# Patient Record
Sex: Female | Born: 1970 | Hispanic: Yes | Marital: Single | Smoking: Never smoker
Health system: Southern US, Community
[De-identification: ages and names within clinical notes are randomized; demographics above are authoritative.]

---

## 2010-11-08 ENCOUNTER — Ambulatory Visit: Payer: Self-pay | Admitting: Family Medicine

## 2010-11-09 ENCOUNTER — Ambulatory Visit: Payer: Self-pay

## 2010-11-20 ENCOUNTER — Emergency Department: Payer: Self-pay | Admitting: *Deleted

## 2010-11-22 LAB — PATHOLOGY REPORT

## 2010-12-27 ENCOUNTER — Ambulatory Visit: Payer: Self-pay

## 2013-01-03 ENCOUNTER — Ambulatory Visit: Payer: Self-pay | Admitting: Family Medicine

## 2013-01-03 LAB — PREGNANCY, URINE: Pregnancy Test, Urine: NEGATIVE m[IU]/mL

## 2013-03-12 ENCOUNTER — Ambulatory Visit: Payer: Self-pay

## 2013-03-12 LAB — PREGNANCY, URINE: Pregnancy Test, Urine: POSITIVE m[IU]/mL

## 2013-04-25 ENCOUNTER — Emergency Department: Payer: Self-pay | Admitting: Emergency Medicine

## 2013-04-25 LAB — CBC WITH DIFFERENTIAL/PLATELET
BASOS ABS: 0.1 10*3/uL (ref 0.0–0.1)
BASOS PCT: 0.9 %
EOS ABS: 0.3 10*3/uL (ref 0.0–0.7)
Eosinophil %: 2.9 %
HCT: 25.2 % — ABNORMAL LOW (ref 35.0–47.0)
HGB: 7.8 g/dL — ABNORMAL LOW (ref 12.0–16.0)
Lymphocyte #: 2.4 10*3/uL (ref 1.0–3.6)
Lymphocyte %: 25.7 %
MCH: 19.8 pg — ABNORMAL LOW (ref 26.0–34.0)
MCHC: 30.8 g/dL — AB (ref 32.0–36.0)
MCV: 64 fL — ABNORMAL LOW (ref 80–100)
MONOS PCT: 4.4 %
Monocyte #: 0.4 x10 3/mm (ref 0.2–0.9)
NEUTROS PCT: 66.1 %
Neutrophil #: 6.1 10*3/uL (ref 1.4–6.5)
Platelet: 218 10*3/uL (ref 150–440)
RBC: 3.92 10*6/uL (ref 3.80–5.20)
RDW: 18 % — ABNORMAL HIGH (ref 11.5–14.5)
WBC: 9.3 10*3/uL (ref 3.6–11.0)

## 2013-04-25 LAB — COMPREHENSIVE METABOLIC PANEL
ALT: 16 U/L (ref 12–78)
ANION GAP: 4 — AB (ref 7–16)
AST: 20 U/L (ref 15–37)
Albumin: 3.7 g/dL (ref 3.4–5.0)
Alkaline Phosphatase: 105 U/L
BILIRUBIN TOTAL: 0.3 mg/dL (ref 0.2–1.0)
BUN: 17 mg/dL (ref 7–18)
CALCIUM: 8.2 mg/dL — AB (ref 8.5–10.1)
Chloride: 108 mmol/L — ABNORMAL HIGH (ref 98–107)
Co2: 25 mmol/L (ref 21–32)
Creatinine: 0.61 mg/dL (ref 0.60–1.30)
EGFR (African American): >60
EGFR (Non-African Amer.): >60
Glucose: 88 mg/dL (ref 65–99)
Osmolality: 275 (ref 275–301)
Potassium: 3.4 mmol/L — ABNORMAL LOW (ref 3.5–5.1)
SODIUM: 137 mmol/L (ref 136–145)
Total Protein: 7.9 g/dL (ref 6.4–8.2)

## 2013-04-25 LAB — LIPASE, BLOOD: Lipase: 157 U/L (ref 73–393)

## 2013-04-25 LAB — CBC
HCT: 26.5 % — ABNORMAL LOW (ref 35.0–47.0)
HGB: 8.4 g/dL — ABNORMAL LOW (ref 12.0–16.0)
MCH: 21.4 pg — ABNORMAL LOW (ref 26.0–34.0)
MCHC: 31.5 g/dL — AB (ref 32.0–36.0)
MCV: 68 fL — AB (ref 80–100)
Platelet: 174 10*3/uL (ref 150–440)
RBC: 3.9 10*6/uL (ref 3.80–5.20)
RDW: 22.1 % — ABNORMAL HIGH (ref 11.5–14.5)
WBC: 12.6 10*3/uL — ABNORMAL HIGH (ref 3.6–11.0)

## 2013-04-25 LAB — HCG, QUANTITATIVE, PREGNANCY: Beta Hcg, Quant.: 4502 m[IU]/mL — ABNORMAL HIGH

## 2013-05-01 ENCOUNTER — Ambulatory Visit: Payer: Self-pay | Admitting: Obstetrics and Gynecology

## 2013-05-01 LAB — BASIC METABOLIC PANEL
Anion Gap: 6 — ABNORMAL LOW (ref 7–16)
BUN: 17 mg/dL (ref 7–18)
CALCIUM: 8.6 mg/dL (ref 8.5–10.1)
Chloride: 107 mmol/L (ref 98–107)
Co2: 27 mmol/L (ref 21–32)
Creatinine: 0.68 mg/dL (ref 0.60–1.30)
EGFR (African American): >60
EGFR (Non-African Amer.): >60
Glucose: 100 mg/dL — ABNORMAL HIGH (ref 65–99)
Osmolality: 281 (ref 275–301)
Potassium: 3.5 mmol/L (ref 3.5–5.1)
SODIUM: 140 mmol/L (ref 136–145)

## 2013-05-01 LAB — CBC WITH DIFFERENTIAL/PLATELET
BASOS ABS: 0.1 10*3/uL (ref 0.0–0.1)
Basophil %: 1.2 %
EOS ABS: 0.3 10*3/uL (ref 0.0–0.7)
EOS PCT: 6.2 %
HCT: 27.5 % — AB (ref 35.0–47.0)
HGB: 8.2 g/dL — AB (ref 12.0–16.0)
Lymphocyte #: 1.5 10*3/uL (ref 1.0–3.6)
Lymphocyte %: 28 %
MCH: 20.3 pg — ABNORMAL LOW (ref 26.0–34.0)
MCHC: 29.8 g/dL — AB (ref 32.0–36.0)
MCV: 68 fL — ABNORMAL LOW (ref 80–100)
MONO ABS: 0.4 x10 3/mm (ref 0.2–0.9)
Monocyte %: 6.6 %
NEUTROS PCT: 58 %
Neutrophil #: 3.2 10*3/uL (ref 1.4–6.5)
PLATELETS: 190 10*3/uL (ref 150–440)
RBC: 4.04 10*6/uL (ref 3.80–5.20)
RDW: 21.6 % — AB (ref 11.5–14.5)
WBC: 5.5 10*3/uL (ref 3.6–11.0)

## 2013-05-04 LAB — PATHOLOGY REPORT

## 2013-05-08 ENCOUNTER — Ambulatory Visit: Payer: Self-pay | Admitting: Physician Assistant

## 2013-05-19 ENCOUNTER — Encounter: Payer: Self-pay | Admitting: Physician Assistant

## 2013-05-29 ENCOUNTER — Encounter: Payer: Self-pay | Admitting: Physician Assistant

## 2013-06-11 ENCOUNTER — Ambulatory Visit: Payer: Self-pay | Admitting: Physician Assistant

## 2013-06-11 LAB — PREGNANCY, URINE: Pregnancy Test, Urine: NEGATIVE m[IU]/mL

## 2013-06-29 ENCOUNTER — Encounter: Payer: Self-pay | Admitting: Physician Assistant

## 2013-07-29 ENCOUNTER — Encounter: Payer: Self-pay | Admitting: Physician Assistant

## 2014-03-22 ENCOUNTER — Ambulatory Visit: Payer: Self-pay | Admitting: Registered Nurse

## 2014-03-29 ENCOUNTER — Ambulatory Visit: Payer: Self-pay | Admitting: Registered Nurse

## 2014-05-22 NOTE — Op Note (Signed)
PATIENT NAME:  Charlene Mcintosh, Charlene Mcintosh MR#:  [id number] DATE OF BIRTH:  02/11/1970  DATE OF PROCEDURE:  05/01/2013  PREOPERATIVE DIAGNOSIS:  Incomplete abortion.  POSTOPERATIVE DIAGNOSIS:  Incomplete abortion, plus retained products of conception.   PROCEDURE:  Suction curette D and C.   ESTIMATED BLOOD LOSS:  50 mL.   FINDINGS:  Products of conception, approximately nine week size uterus, closed cervix, firm fundus.   SURGEON:  Bengie Kaucher C. Marlean Mortell, M.D.   ANESTHESIA:  General endotracheal anesthesia.    PROCEDURE:  The patient was taken to the Operating Room and placed in the supine position.  After adequate general endotracheal anesthesia (Dictation Anomaly) was instilled the patient was prepped and draped in the usual sterile fashion.  Timeout was performed.  A side-opening speculum was placed in the patient's vagina.  The anterior lip of the cervix was grasped with a single-tooth tenaculum.  The uterus was sounded.  The cervix was serially dilated to accommodate an 8 curved rigid curette.  Suction was placed into the uterus and the suction curette was turned on.  Products of conception were removed.  Gentle curettage was performed with a metal curette.  Gritty surface was identified.  No extra tissue was felt.  One final pass of the suction was performed.  The single-tooth tenaculum was removed.  The patient was given Methergine 0.2 mg IM.  The fundus was massaged with bimanual exam.  The uterus was found to be firm.  Good hemostasis was identified.  The patient was catheterized.  Clear urine was noted.  The patient was laid supine and taken to the recovery room after having tolerated the procedure.     ____________________________ Ocie Stanzione C. Deziyah Arvin, MD cck:ea D: 05/02/2013 16:11:03 ET T: 05/02/2013 17:17:54 ET JOB#: [id number]  cc: Janney Priego C. Elizet Kaplan, MD, <Dictator> Thamara Leger C Alyana Kreiter MD ELECTRONICALLY SIGNED 05/05/2013 14:43 

## 2014-05-22 NOTE — Op Note (Signed)
PATIENT NAME:  Charlene RocheROSENDO JIMENEZ, [name] MR#:  [id number] DATE OF BIRTH:  [birth year]  DATE OF PROCEDURE:  05/01/2013  PREOPERATIVE DIAGNOSIS:  Incomplete abortion.  POSTOPERATIVE DIAGNOSIS:  Incomplete abortion, plus retained products of conception.   PROCEDURE:  Suction curette D and C.   ESTIMATED BLOOD LOSS:  50 mL.   FINDINGS:  Products of conception, approximately nine week size uterus, closed cervix, firm fundus.   SURGEON:  Elliot Gurneyarrie C. Limuel Nieblas, M.D.   ANESTHESIA:  General endotracheal anesthesia.    PROCEDURE:  The patient was taken to the Operating Room and placed in the supine position.  After adequate general endotracheal anesthesia (Dictation Anomaly) was instilled the patient was prepped and draped in the usual sterile fashion.  Timeout was performed.  A side-opening speculum was placed in the patient's vagina.  The anterior lip of the cervix was grasped with a single-tooth tenaculum.  The uterus was sounded.  The cervix was serially dilated to accommodate an 8 curved rigid curette.  Suction was placed into the uterus and the suction curette was turned on.  Products of conception were removed.  Gentle curettage was performed with a metal curette.  Gritty surface was identified.  No extra tissue was felt.  One final pass of the suction was performed.  The single-tooth tenaculum was removed.  The patient was given Methergine 0.2 mg IM.  The fundus was massaged with bimanual exam.  The uterus was found to be firm.  Good hemostasis was identified.  The patient was catheterized.  Clear urine was noted.  The patient was laid supine and taken to the recovery room after having tolerated the procedure.     ____________________________ Elliot Gurneyarrie C. Jessie Cowher, MD cck:ea D: 05/02/2013 16:11:03 ET T: 05/02/2013 17:17:54 ET JOB#: [id number]  cc: Elliot Gurneyarrie C. Genean Adamski, MD, <Dictator> Elliot GurneyARRIE C Burns Timson MD ELECTRONICALLY SIGNED 05/05/2013 14:43

## 2020-11-30 ENCOUNTER — Emergency Department: Payer: Worker's Compensation

## 2020-11-30 ENCOUNTER — Emergency Department
Admission: EM | Admit: 2020-11-30 | Discharge: 2020-11-30 | Disposition: A | Discharge status: Home / Self Care | Payer: Worker's Compensation | Attending: Emergency Medicine | Admitting: Emergency Medicine

## 2020-11-30 ENCOUNTER — Other Ambulatory Visit: Payer: Self-pay

## 2020-11-30 DIAGNOSIS — X509XXA Other and unspecified overexertion or strenuous movements or postures, initial encounter: Secondary | ICD-10-CM | POA: Diagnosis not present

## 2020-11-30 DIAGNOSIS — S3992XA Unspecified injury of lower back, initial encounter: Secondary | ICD-10-CM | POA: Diagnosis present

## 2020-11-30 DIAGNOSIS — S39012A Strain of muscle, fascia and tendon of lower back, initial encounter: Secondary | ICD-10-CM | POA: Diagnosis not present

## 2020-11-30 MED ORDER — CYCLOBENZAPRINE HCL 10 MG PO TABS
10.0000 mg | ORAL_TABLET | Freq: Once | ORAL | Status: AC
  Administered 2020-11-30: 10 mg via ORAL
  Filled 2020-11-30: qty 1

## 2020-11-30 MED ORDER — CYCLOBENZAPRINE HCL 5 MG PO TABS: 5.0000 mg | ORAL_TABLET | Freq: Three times a day (TID) | ORAL | 0 refills | Status: DC | PRN

## 2020-11-30 MED ORDER — IBUPROFEN 800 MG PO TABS
800.0000 mg | ORAL_TABLET | Freq: Once | ORAL | Status: AC
  Administered 2020-11-30: 800 mg via ORAL
  Filled 2020-11-30: qty 1

## 2020-11-30 MED ORDER — LIDOCAINE 5 % EX PTCH
1.0000 | MEDICATED_PATCH | Freq: Once | CUTANEOUS | Status: DC
  Administered 2020-11-30: 1 via TRANSDERMAL
  Filled 2020-11-30: qty 1

## 2020-11-30 MED ORDER — LIDOCAINE 5 % EX PTCH: 1.0000 | MEDICATED_PATCH | Freq: Two times a day (BID) | CUTANEOUS | 0 refills | Status: AC | PRN

## 2020-11-30 MED ORDER — IBUPROFEN 800 MG PO TABS: 800.0000 mg | ORAL_TABLET | Freq: Three times a day (TID) | ORAL | 0 refills | Status: DC | PRN

## 2020-11-30 NOTE — Discharge Instructions (Addendum)
Take the prescription meds as directed. Apply ice and or moist heat to the sore muscles. Follow-up with [facility] or the provider approved by your employer.   Tome los medicamentos recetados segn las indicaciones. Aplique hielo o calor hmedo a los msculos adoloridos. Seguimiento con [facility] o el proveedor aprobado por su empleador.

## 2020-11-30 NOTE — ED Provider Notes (Signed)
[facility] Emergency Department Provider Note ____________________________________________  Time seen: 1907  I have reviewed the triage vital signs and the nursing notes.  HISTORY  Chief Complaint  Back Pain  History limited by Spanish language.  Tele-interpreter used for interview and exam.   HPI Charlene Mcintosh is a 50 y.o. female presents to the ED for evaluation of lower back pain.  Patient reports a near fall after slipping in water on the floor.  She was carrying an empty box, and was attempted to tiptoe through the pod on the wet floor, when she apparently nearly slipped, creating a sudden split stance.  She denies any head injury or LOC.  She reports pain in the lower back and radiates up towards the head.  She denies any other injury at this time patient denies any bladder or bowel incontinence, foot drop, or saddle anesthesia.  History reviewed. No pertinent past medical history.  There are no problems to display for this patient.   History reviewed. No pertinent surgical history.  Prior to Admission medications   Medication Sig Start Date End Date Taking? Authorizing Provider  cyclobenzaprine (FLEXERIL) 5 MG tablet Take 1 tablet (5 mg total) by mouth 3 (three) times daily as needed. 11/30/20  Yes Tandra Rosado, Charlesetta Ivory, PA-C  ibuprofen (ADVIL) 800 MG tablet Take 1 tablet (800 mg total) by mouth every 8 (eight) hours as needed. 11/30/20  Yes Akeiba Axelson, Charlesetta Ivory, PA-C  lidocaine (LIDODERM) 5 % Place 1 patch onto the skin every 12 (twelve) hours as needed for up to 10 days. Remove & Discard patch after 12 hours of wear each day. 11/30/20 12/10/20 Yes Tonye Tancredi, Charlesetta Ivory, PA-C    Allergies Patient has no allergy information on record.  History reviewed. No pertinent family history.  Social History    Review of Systems  Constitutional: Negative for fever. Eyes: Negative for visual changes. ENT: Negative for sore  throat. Cardiovascular: Negative for chest pain. Respiratory: Negative for shortness of breath. Gastrointestinal: Negative for abdominal pain, vomiting and diarrhea. Genitourinary: Negative for dysuria. Musculoskeletal: Positive for back pain. Skin: Negative for rash. Neurological: Negative for headaches, focal weakness or numbness. ____________________________________________  PHYSICAL EXAM:  VITAL SIGNS: ED Triage Vitals  Enc Vitals Group     BP 11/30/20 1716 (!) 187/103     Pulse Rate 11/30/20 1716 72     Resp 11/30/20 1716 18     Temp 11/30/20 1716 98.4 F (36.9 C)     Temp Source 11/30/20 1716 Oral     SpO2 11/30/20 1716 100 %     Weight 11/30/20 1718 184 lb (83.5 kg)     Height 11/30/20 1718 4\' 11"  (1.499 m)     Head Circumference --      Peak Flow --      Pain Score 11/30/20 1724 8     Pain Loc --      Pain Edu? --      Excl. in GC? --     Constitutional: Alert and oriented. Well appearing and in no distress. Head: Normocephalic and atraumatic. Eyes: Conjunctivae are normal. Normal extraocular movements Neck: Supple. No thyromegaly. Cardiovascular: Normal rate, regular rhythm. Normal distal pulses. Respiratory: Normal respiratory effort. No wheezes/rales/rhonchi. Gastrointestinal: Soft and nontender. No distention. Musculoskeletal: Normal spinal alignment without midline tenderness, spasm, deformity, or step-off.  Patient transitions from sit to stand without assistance patient able demonstrate normal lumbar flexion and extension range.  Nontender with normal range of motion in  all extremities.  Neurologic: Cranial nerves II to XII grossly intact.  Normal LE DTRs bilaterally.  Negative supine straight leg raise bilaterally.  Normal gait without ataxia. Normal speech and language. No gross focal neurologic deficits are appreciated. Skin:  Skin is warm, dry and intact. No rash noted. Psychiatric: Mood and affect are normal. Patient exhibits appropriate insight and  judgment. ____________________________________________    {LABS (pertinent positives/negatives)  ____________________________________________  {EKG  ____________________________________________   RADIOLOGY Official radiology report(s): DG Lumbar Spine 2-3 Views  Result Date: 11/30/2020 CLINICAL DATA:  Back pain status post work injury. EXAM: LUMBAR SPINE - 2-3 VIEW COMPARISON:  Lumbar radiograph 06/11/2013 FINDINGS: There are 5 lumbar type vertebra. Slight leftward curvature is similar to prior exam. No listhesis. No acute fracture. Vertebral body heights are normal. Minor endplate spurring at multiple levels with preservation of disc spaces. The sacroiliac joints are congruent. IMPRESSION: 1. No acute fracture or subluxation of the lumbar spine. 2. Minor degenerative disc disease. Electronically Signed   By: Narda Rutherford M.D.   On: 11/30/2020 19:32   ____________________________________________  PROCEDURES  Cyclobenzaprine 10 mg p.o. Ibuprofen 800 mg p.o. Lidoderm patch 5% topical  Procedures ____________________________________________   INITIAL IMPRESSION / ASSESSMENT AND PLAN / ED COURSE  As part of my medical decision making, I reviewed the following data within the electronic MEDICAL RECORD NUMBER Radiograph reviewed WNL and Notes from prior ED visits    DDX: lumbar strain, groin sprain, myalgia  Patient with ED evaluation of acute lumbar strain after near fall at the workplace.  She presents for evaluation of her complaints, found to have a reassuring exam without signs of acute neuromuscular deficit.  No red flags on exam.  No radiologic evidence of acute fracture or dislocation.  Patient be treated with anti-inflammatories, muscle relaxants, and topical pain patches.  She will follow with primary provider or the provider approved by her employer.  Return precautions have been reviewed.  Work note is provided as requested.  Charlene Mcintosh was evaluated in Emergency  Department on 11/30/2020 for the symptoms described in the history of present illness. She was evaluated in the context of the global COVID-19 pandemic, which necessitated consideration that the patient might be at risk for infection with the SARS-CoV-2 virus that causes COVID-19. Institutional protocols and algorithms that pertain to the evaluation of patients at risk for COVID-19 are in a state of rapid change based on information released by regulatory bodies including the CDC and federal and state organizations. These policies and algorithms were followed during the patient's care in the ED. ____________________________________________  FINAL CLINICAL IMPRESSION(S) / ED DIAGNOSES  Final diagnoses:  Strain of lumbar region, initial encounter      Lissa Hoard, PA-C 11/30/20 2336    Arnaldo Natal, MD 12/01/20 1544

## 2020-11-30 NOTE — ED Triage Notes (Signed)
Pt comes with c/o injury at work. Pt states back pain. Pt has WC paper in hand.

## 2020-12-16 ENCOUNTER — Ambulatory Visit
Admission: RE | Admit: 2020-12-16 | Discharge: 2020-12-16 | Disposition: A | Discharge status: Home / Self Care | Payer: Worker's Compensation | Source: Ambulatory Visit | Attending: Physician Assistant | Admitting: Physician Assistant

## 2020-12-16 ENCOUNTER — Other Ambulatory Visit: Payer: Self-pay | Admitting: Physician Assistant

## 2020-12-16 DIAGNOSIS — W19XXXA Unspecified fall, initial encounter: Secondary | ICD-10-CM

## 2020-12-16 DIAGNOSIS — R0781 Pleurodynia: Secondary | ICD-10-CM | POA: Diagnosis present

## 2020-12-16 DIAGNOSIS — M5489 Other dorsalgia: Secondary | ICD-10-CM | POA: Insufficient documentation

## 2022-07-24 ENCOUNTER — Emergency Department: Payer: Worker's Compensation

## 2022-07-24 ENCOUNTER — Emergency Department
Admission: EM | Admit: 2022-07-24 | Discharge: 2022-07-24 | Disposition: A | Discharge status: Home / Self Care | Payer: Worker's Compensation | Attending: Emergency Medicine | Admitting: Emergency Medicine

## 2022-07-24 ENCOUNTER — Other Ambulatory Visit: Payer: Self-pay

## 2022-07-24 ENCOUNTER — Emergency Department: Payer: Self-pay

## 2022-07-24 DIAGNOSIS — W208XXA Other cause of strike by thrown, projected or falling object, initial encounter: Secondary | ICD-10-CM | POA: Diagnosis not present

## 2022-07-24 DIAGNOSIS — S161XXA Strain of muscle, fascia and tendon at neck level, initial encounter: Secondary | ICD-10-CM | POA: Diagnosis not present

## 2022-07-24 DIAGNOSIS — S3992XA Unspecified injury of lower back, initial encounter: Secondary | ICD-10-CM | POA: Diagnosis present

## 2022-07-24 DIAGNOSIS — Y990 Civilian activity done for income or pay: Secondary | ICD-10-CM | POA: Insufficient documentation

## 2022-07-24 DIAGNOSIS — S8001XA Contusion of right knee, initial encounter: Secondary | ICD-10-CM | POA: Insufficient documentation

## 2022-07-24 DIAGNOSIS — S39012A Strain of muscle, fascia and tendon of lower back, initial encounter: Secondary | ICD-10-CM | POA: Diagnosis not present

## 2022-07-24 MED ORDER — IBUPROFEN 800 MG PO TABS
800.0000 mg | ORAL_TABLET | Freq: Once | ORAL | Status: AC
  Administered 2022-07-24: 800 mg via ORAL
  Filled 2022-07-24: qty 1

## 2022-07-24 MED ORDER — CYCLOBENZAPRINE HCL 5 MG PO TABS
5.0000 mg | ORAL_TABLET | Freq: Three times a day (TID) | ORAL | 0 refills | Status: AC | PRN
Start: 2022-07-24 — End: ?

## 2022-07-24 MED ORDER — IBUPROFEN 800 MG PO TABS: 800.0000 mg | ORAL_TABLET | Freq: Three times a day (TID) | ORAL | 0 refills | Status: AC | PRN

## 2022-07-24 MED ORDER — CYCLOBENZAPRINE HCL 10 MG PO TABS
10.0000 mg | ORAL_TABLET | Freq: Once | ORAL | Status: AC
  Administered 2022-07-24: 10 mg via ORAL
  Filled 2022-07-24: qty 1

## 2022-07-24 NOTE — ED Provider Notes (Signed)
[facility] Emergency Department Provider Note     Event Date/Time   First MD Initiated Contact with Patient 07/24/22 2122     (approximate)   History   Back Pain and Leg Pain   HPI  History limited by Spanish language.  Tele-interpreter [phone]) used for interview and exam.  Charlene Mcintosh is a 52 y.o. female with a noncontributory medical history, presents to the ED for evaluation of work-related injury.  Patient was at work when she apparently was near a machine that began to fall.  Patient tried to grab the machine to keep it from falling onto her. She describes having to hold this robotic arm up with some significant use of strength.  Denies any out right fall or being trapped in the neck machine.  He does report that a table neither did tipped over hitting her in the right knee.  She is now presenting with complaints of acute anterior chest and upper back muscle pain as well as some right knee discomfort.  Patient denies any head injury, chest pain, shortness of breath.   Physical Exam   Triage Vital Signs: ED Triage Vitals  Enc Vitals Group     BP 07/24/22 2032 (!) 157/103     Pulse Rate 07/24/22 2032 79     Resp 07/24/22 2032 18     Temp 07/24/22 2032 98.3 F (36.8 C)     Temp Source 07/24/22 2032 Oral     SpO2 07/24/22 2032 98 %     Weight 07/24/22 2033 190 lb (86.2 kg)     Height 07/24/22 2033 4\' 11"  (1.499 m)     Head Circumference --      Peak Flow --      Pain Score 07/24/22 2033 7     Pain Loc --      Pain Edu? --      Excl. in GC? --     Most recent vital signs: Vitals:   07/24/22 2032  BP: (!) 157/103  Pulse: 79  Resp: 18  Temp: 98.3 F (36.8 C)  SpO2: 98%    General Awake, no distress. NAD HEENT NCAT. PERRL. EOMI. No rhinorrhea. Mucous membranes are moist. CV:  Good peripheral perfusion.  RESP:  Normal effort.  ABD:  No distention.  MSK:  Normal active range of motion of the upper and lower extremities.   Right knee without any dysfunction or evidence internal derangement.   ED Results / Procedures / Treatments   Labs (all labs ordered are listed, but only abnormal results are displayed) Labs Reviewed - No data to display   EKG   RADIOLOGY  I personally viewed and evaluated these images as part of my medical decision making, as well as reviewing the written report by the radiologist.  ED Provider Interpretation: no acute findings  DG Cervical Spine Complete  Result Date: 07/24/2022 CLINICAL DATA:  Injury, pain EXAM: CERVICAL SPINE - COMPLETE 4+ VIEW COMPARISON:  None Available. FINDINGS: Frontal, bilateral oblique, and lateral views of the cervical spine are obtained on 8 images. Alignment is anatomic to the cervicothoracic junction on the lateral view. There are no acute fractures. Disc spaces are well preserved. Neural foramina are widely patent. Prevertebral soft tissues are unremarkable. Lung apices are clear. IMPRESSION: 1. Unremarkable cervical spine. Electronically Signed   By: Sharlet Salina M.D.   On: 07/24/2022 22:20   DG Lumbar Spine Complete  Result Date: 07/24/2022 CLINICAL DATA:  Strain, injury, back  pain EXAM: LUMBAR SPINE - COMPLETE 4+ VIEW COMPARISON:  11/30/2020 FINDINGS: Frontal, bilateral oblique, and lateral views of the lumbar spine are obtained. There are 5 non-rib-bearing lumbar type vertebral bodies identified, with mild left convex curvature centered at the L2-3 level. Otherwise alignment is anatomic. There are no acute fractures. Mild spondylosis at the thoracolumbar junction. Mild facet hypertrophy from L3-4 through L5-S1. Sacroiliac joints are normal. IMPRESSION: 1. No acute lumbar spine fracture. 2. Lower lumbar facet hypertrophy.  Mild left convex scoliosis. Electronically Signed   By: Sharlet Salina M.D.   On: 07/24/2022 22:20   DG Knee Complete 4 Views Right  Result Date: 07/24/2022 CLINICAL DATA:  Injury, right knee pain EXAM: RIGHT KNEE - COMPLETE 4+  VIEW COMPARISON:  None Available. FINDINGS: Frontal, bilateral oblique, and lateral views of the right knee are obtained on 4 images. No acute fracture, subluxation, or dislocation. Joint spaces are well preserved. No joint effusion. Soft tissues are unremarkable. IMPRESSION: 1. Unremarkable right knee. Electronically Signed   By: Sharlet Salina M.D.   On: 07/24/2022 21:03     PROCEDURES:  Critical Care performed: No  Procedures   MEDICATIONS ORDERED IN ED: Medications  ibuprofen (ADVIL) tablet 800 mg (800 mg Oral Given 07/24/22 2214)  cyclobenzaprine (FLEXERIL) tablet 10 mg (10 mg Oral Given 07/24/22 2214)     IMPRESSION / MDM / ASSESSMENT AND PLAN / ED COURSE  I reviewed the triage vital signs and the nursing notes.                              Differential diagnosis includes, but is not limited to, knee strain, knee contusion, knee fracture, lumbar strain, cervical strain, lumbar fracture, myalgias  Patient's presentation is most consistent with acute complicated illness / injury requiring diagnostic workup.  Patient's diagnosis is consistent with muscle strain and myalgias secondary to injury.  With reassuring exam overall.  No signs of any acute neuromuscular deficit without evidence of any internal derangement to the right knee.  No radiologic evidence of any acute fracture or dislocation.  Patient will be discharged home with prescriptions for ibuprofen and Flexeril. Patient is to follow up with [facility] or her Worker's Comp. improved provider as needed or otherwise directed. Patient is given ED precautions to return to the ED for any worsening or new symptoms.   FINAL CLINICAL IMPRESSION(S) / ED DIAGNOSES   Final diagnoses:  Strain of lumbar region, initial encounter  Cervical muscle strain, initial encounter  Contusion of right knee, initial encounter     Rx / DC Orders   ED Discharge Orders          Ordered    cyclobenzaprine (FLEXERIL) 5 MG tablet  3  times daily PRN        07/24/22 2233    ibuprofen (ADVIL) 800 MG tablet  Every 8 hours PRN        07/24/22 2233             Note:  This document was prepared using Dragon voice recognition software and may include unintentional dictation errors.    Lissa Hoard, PA-C 07/24/22 2239    Chesley Noon, MD 07/24/22 [phone]

## 2022-07-24 NOTE — ED Triage Notes (Signed)
Pt to ED via POV c/o back pain, leg pain. Pt reports she was at work near machine when it fell, pt tried to grab hold of machine but says it weighed too much. Pt now complaining of back pain and right knee pain. Pt here for WC. Denies CP,SOB

## 2022-07-24 NOTE — ED Notes (Signed)
Pt at radiology  ?

## 2022-07-24 NOTE — Discharge Instructions (Signed)
Your exam and x-rays are all normal reassuring vital signs of any serious bony injury.  You will experience muscle strain, pain, and spasm.  We would treat your symptoms with anti-inflammatories and muscle relaxants.  May also apply ice to the knee to help with any discomfort there.  Follow-up with [facility] or the medical provider approved by your companies Worker's Comp. carrier.  Su examen y radiografas son signos vitales normales y tranquilizadores de cualquier lesin sea grave.  Experimentar tensin muscular, dolor y espasmos.  Trataramos tus sntomas con antiinflamatorios y Radiation protection practitioner.  Tambin puede aplicar hielo en la rodilla para ayudar con [employer].  Haga un seguimiento con la [facility]in [facility] [place] o el proveedor mdico aprobado por la [employer] de su empresa. transportador.

## 2022-07-24 NOTE — ED Notes (Signed)
WC screening performed at the bedside by Doree Fudge, NT.

## 2022-07-25 NOTE — ED Notes (Signed)
Workers comp completed for pt as directed by workers Journalist, [employer]. Drug and alc saliva test done and sent to [facility]. Documents signed and put into medical records folder. Copy of document was givin to pt to keep one for herself and give one to her employer.

## 2023-03-17 IMAGING — CR DG LUMBAR SPINE 2-3V
3 series · 3 of 3 positions shown · non-contrast
Comparison: Lumbar radiograph 06/11/2013

CLINICAL DATA: Back pain status post work injury.

EXAM:
LUMBAR SPINE - 2-3 VIEW

[l-spine ap]
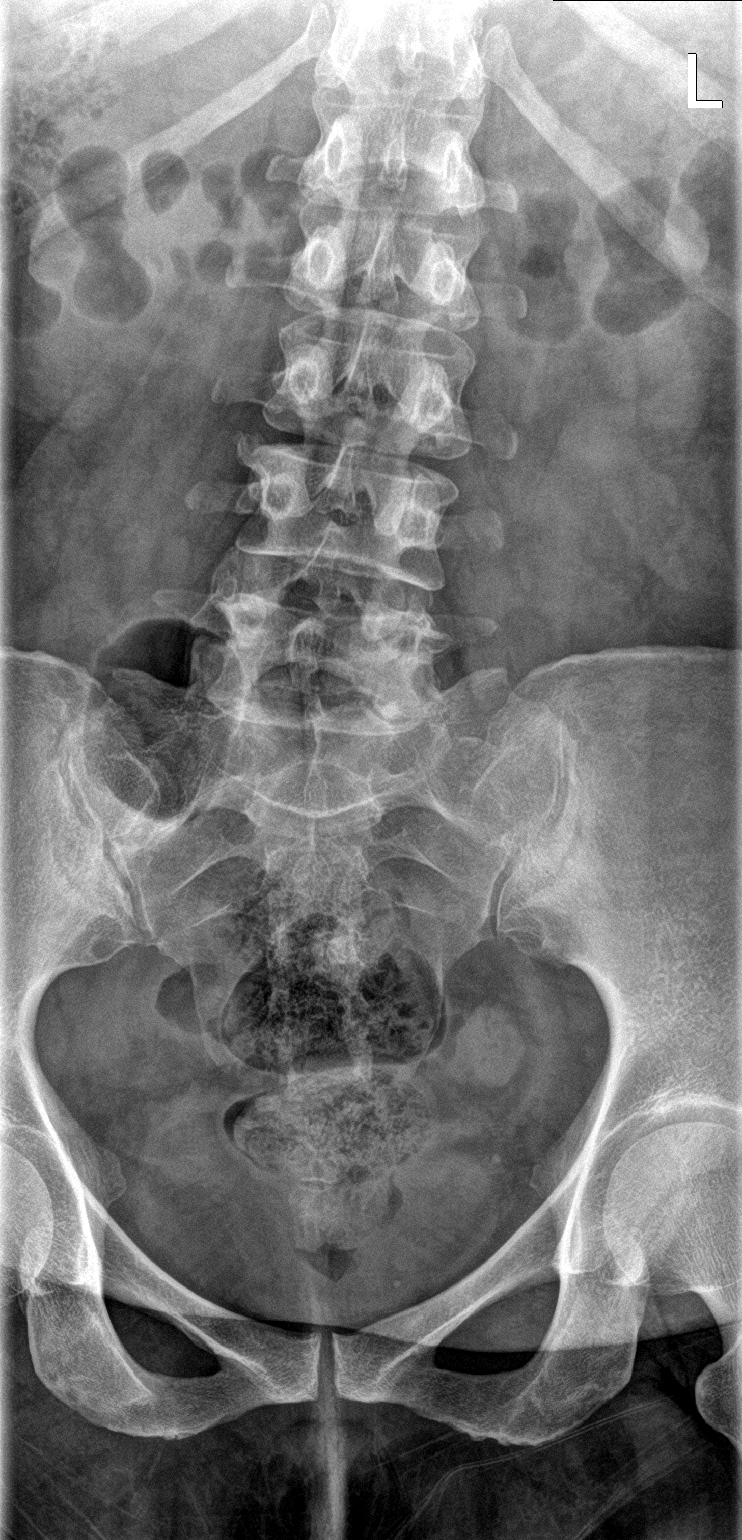

[l-spine lat]
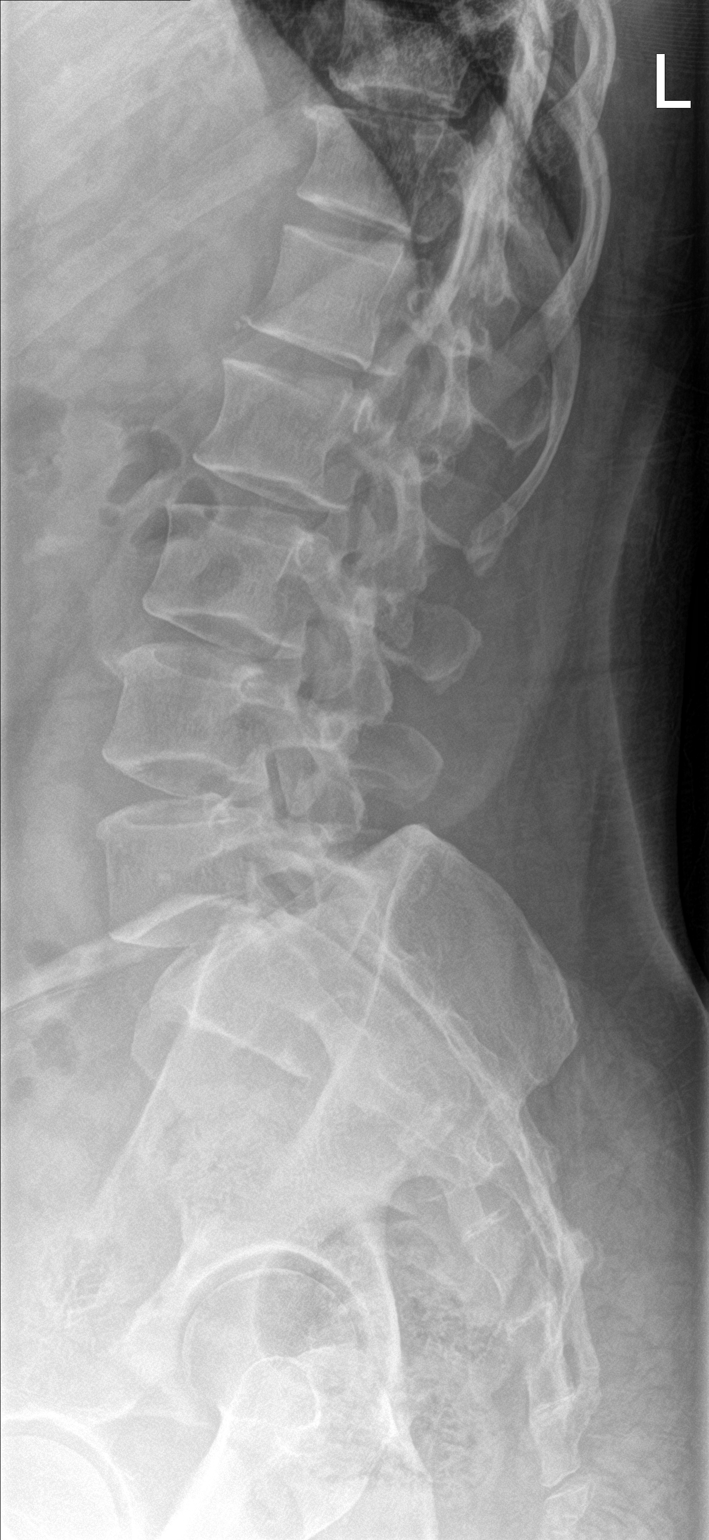

[l-spine spot]
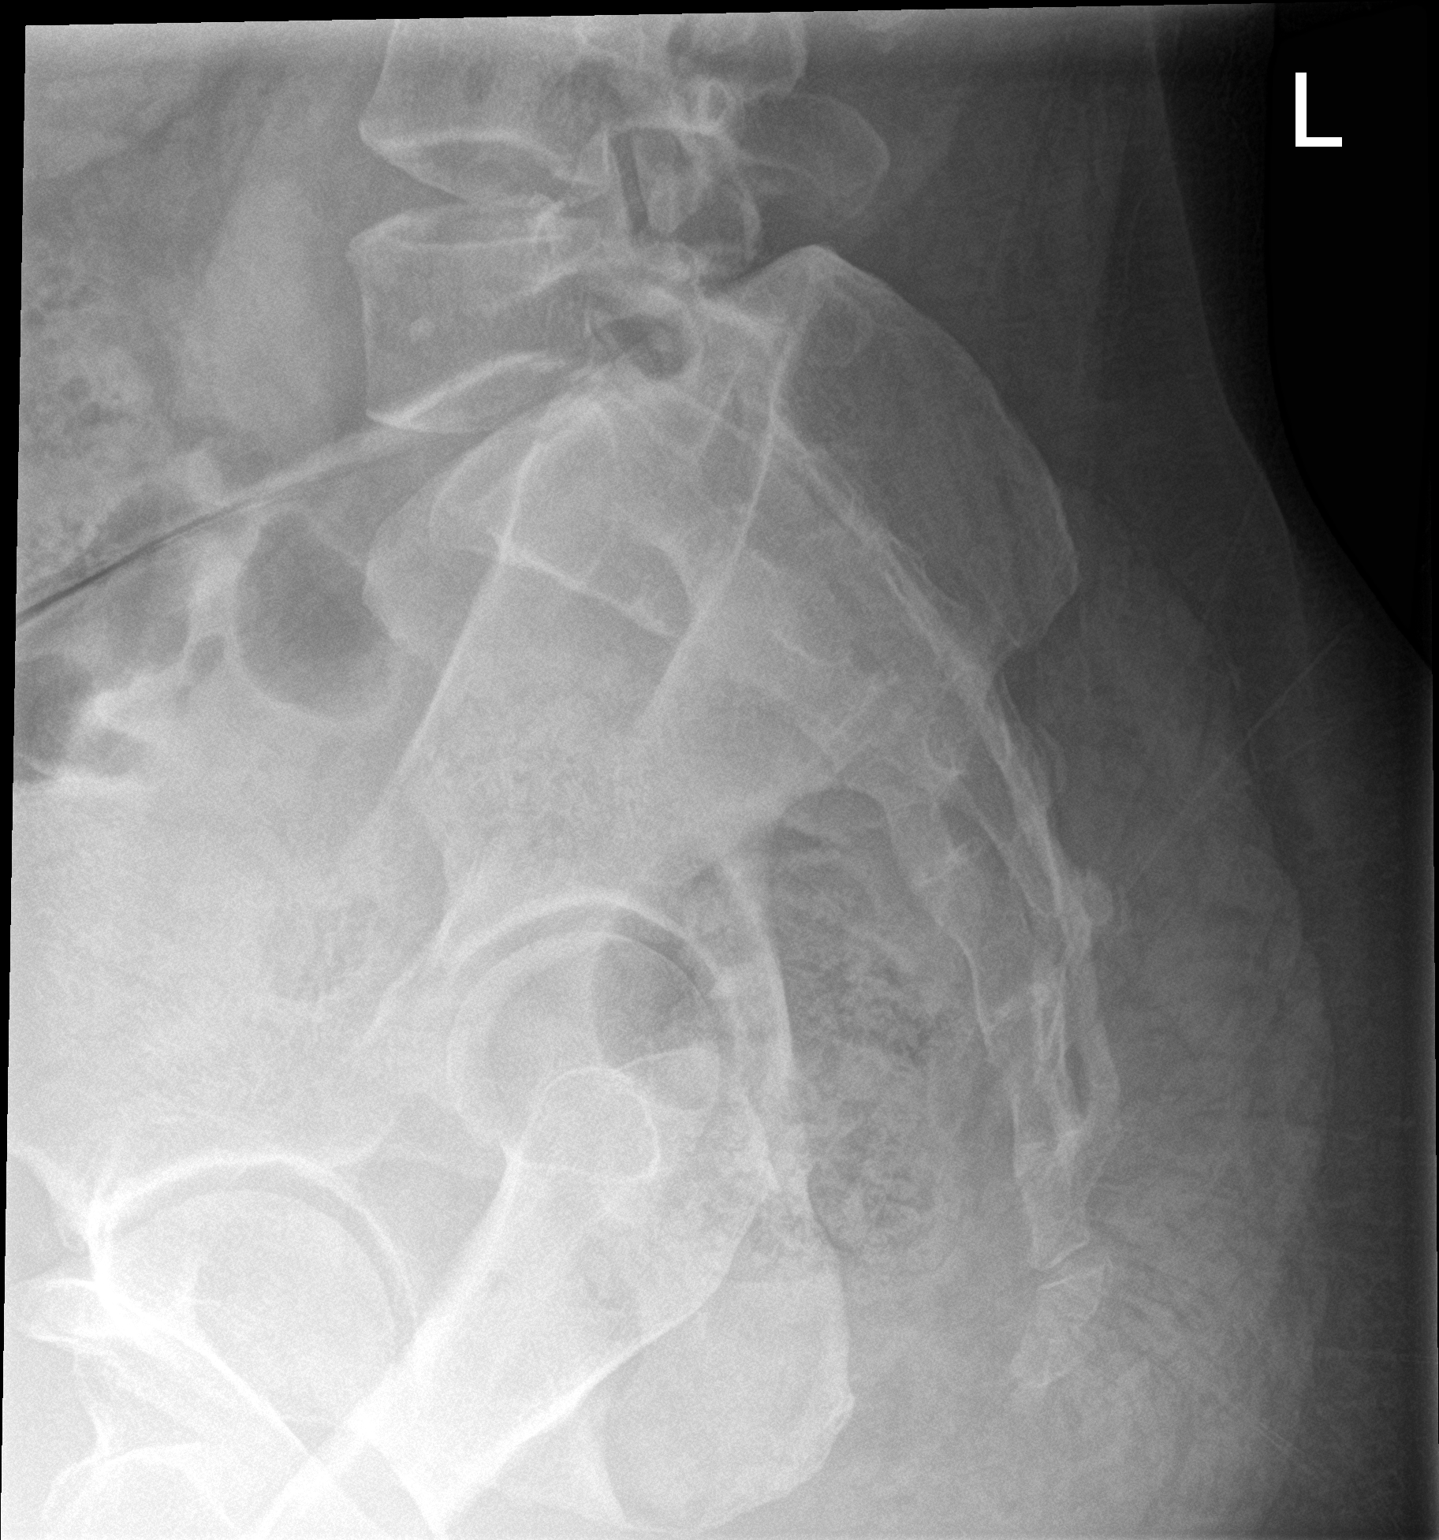

[3 of 3 positions shown; findings below may reference images not displayed]

FINDINGS: There are 5 lumbar type vertebra. Slight leftward curvature is
similar to prior exam. No listhesis. No acute fracture. Vertebral
body heights are normal. Minor endplate spurring at multiple levels
with preservation of disc spaces. The sacroiliac joints are
congruent.
IMPRESSION: 1. No acute fracture or subluxation of the lumbar spine.
2. Minor degenerative disc disease.

## 2023-04-02 IMAGING — CR DG RIBS W/ CHEST 3+V*L*
1 series · 5 of 5 positions shown · non-contrast
Comparison: Chest radiographs [DATE].

CLINICAL DATA: 50-year-old female status post fall on [REDACTED]
with left side rib pain radiating to the upper back.

EXAM:
LEFT RIBS AND CHEST - 3+ VIEW

[Series 1: dg ribs unilateral w/chest left · 0.14mm/px · 5 of 5 slices shown]
[im 1/5]
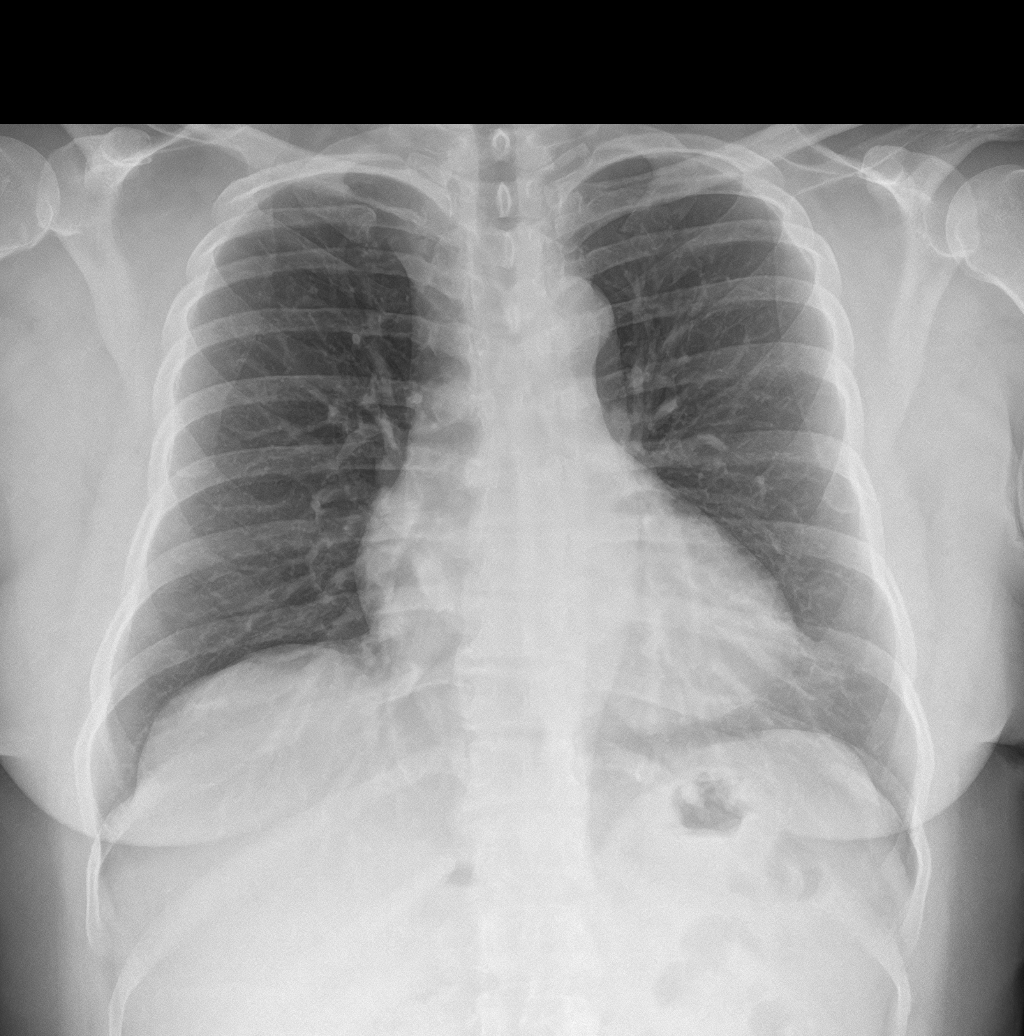
[im 2/5]
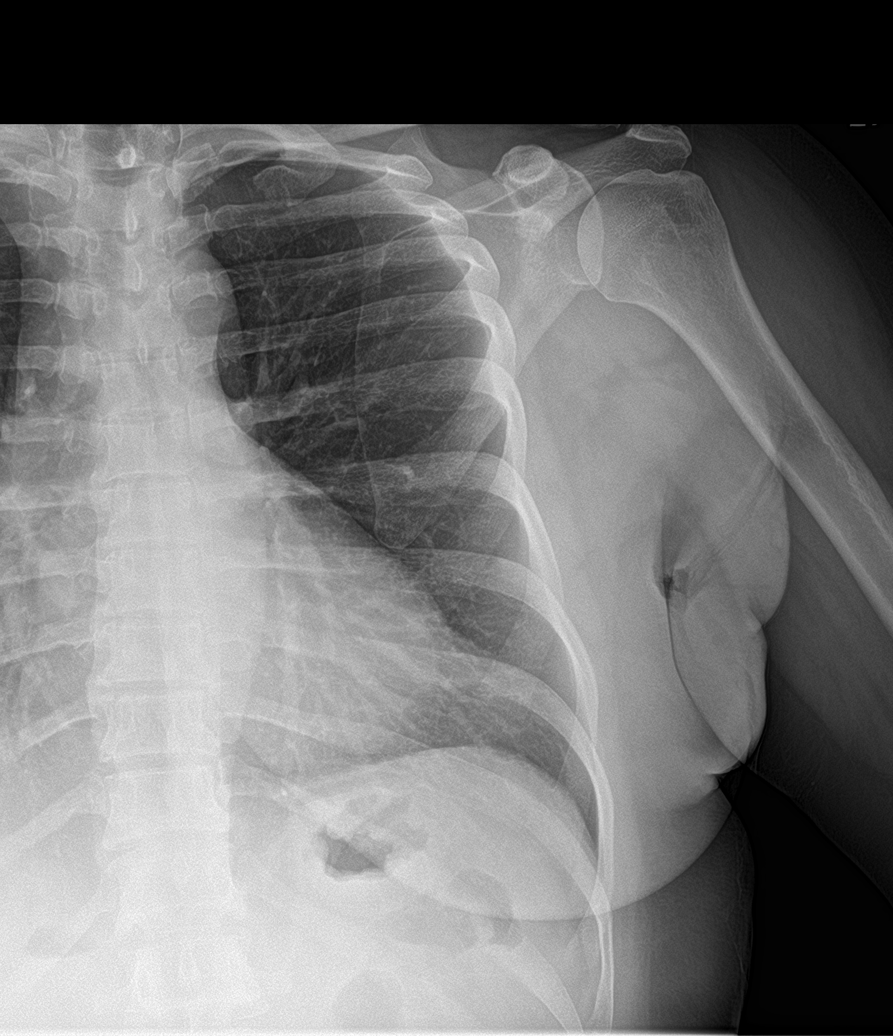
[im 3/5]
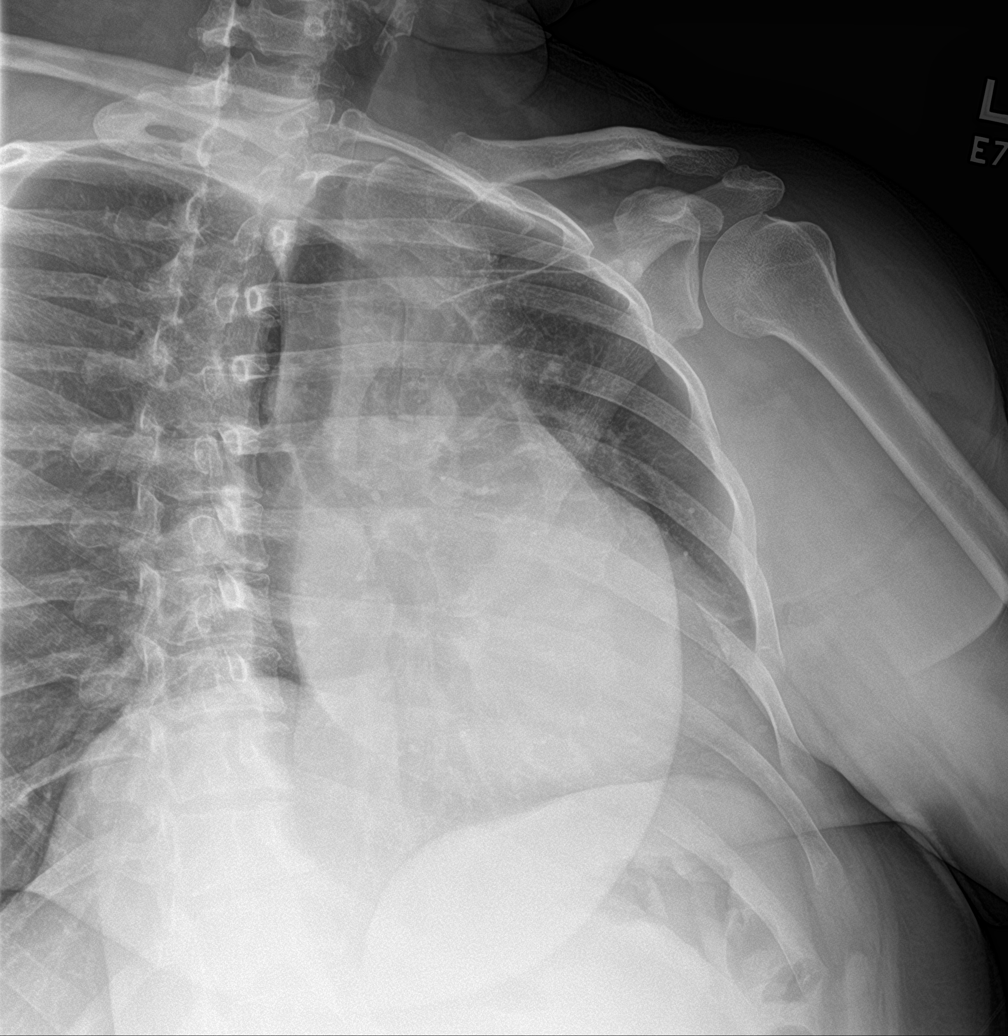
[im 4/5]
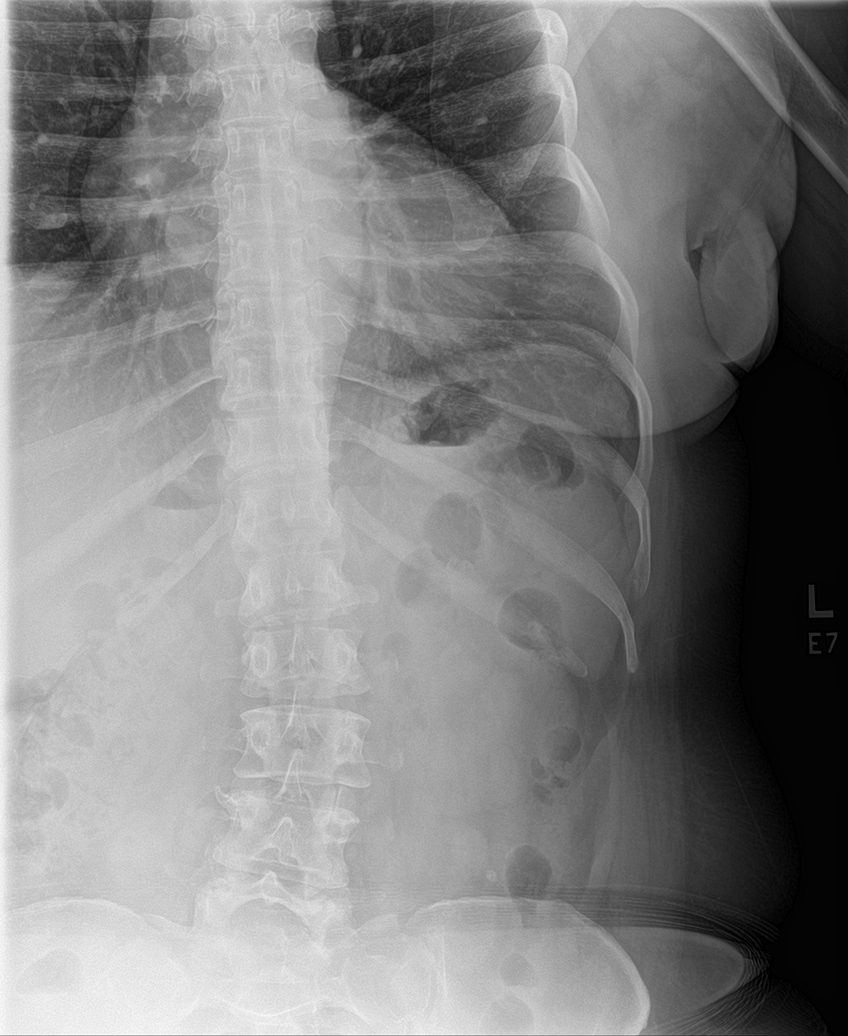
[im 5/5]
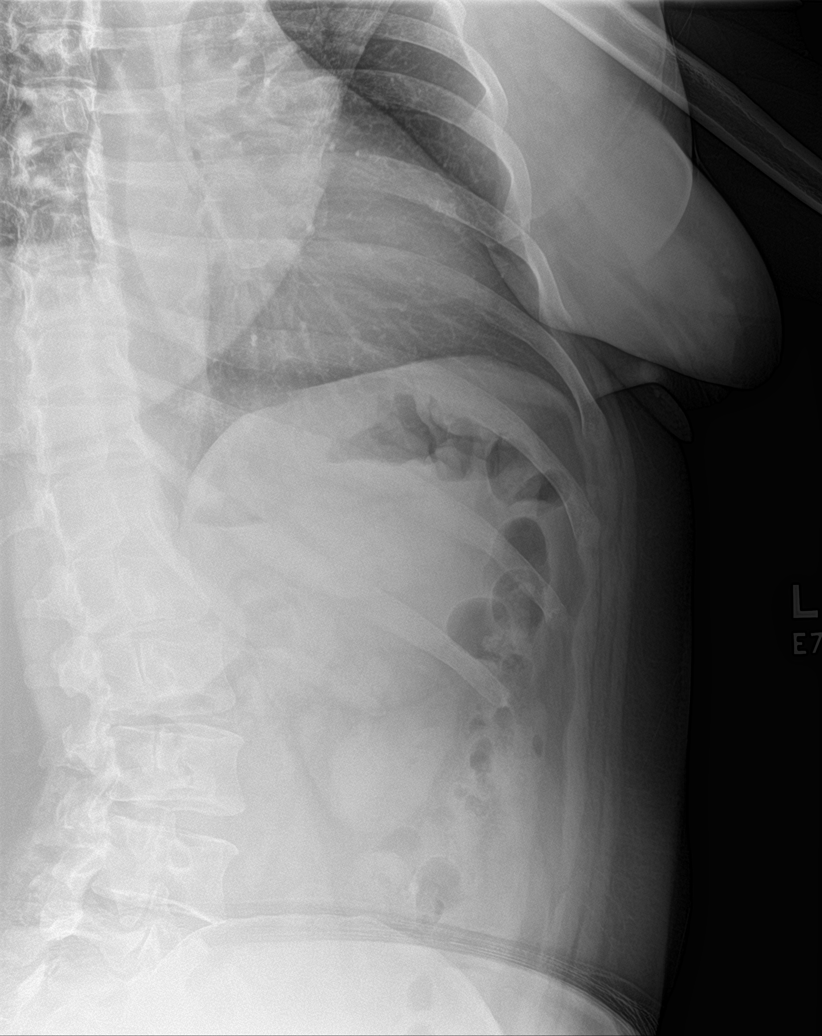

[5 of 5 positions shown; findings below may reference images not displayed]

FINDINGS: Lung volumes remain normal. Cardiac size is stable but at the upper
limits of normal. Other mediastinal contours are within normal
limits. Visualized tracheal air column is within normal limits. No
pneumothorax or pleural effusion. Both lungs appear clear.

Negative visible bowel gas.

Bone mineralization is within normal limits. Oblique views of the
left ribs. No left rib fracture identified. Other visible osseous
structures appear intact. There is mild levoconvex lumbar scoliosis.
IMPRESSION: 1. No left rib fracture identified.
2. No acute cardiopulmonary abnormality.
